# Patient Record
Sex: Female | Born: 1945 | Race: Black or African American | Hispanic: No | Marital: Single | Smoking: Never smoker
Health system: Southern US, Community
[De-identification: ages and names within clinical notes are randomized; demographics above are authoritative.]

---

## 2008-12-01 ENCOUNTER — Encounter: Admission: RE | Admit: 2008-12-01 | Discharge: 2008-12-01 | Payer: Self-pay | Admitting: Internal Medicine

## 2010-02-01 ENCOUNTER — Encounter: Admission: RE | Admit: 2010-02-01 | Discharge: 2010-02-01 | Payer: Self-pay | Admitting: Internal Medicine

## 2010-07-08 ENCOUNTER — Encounter: Payer: Self-pay | Admitting: Internal Medicine

## 2010-09-12 ENCOUNTER — Other Ambulatory Visit (HOSPITAL_COMMUNITY)
Admission: RE | Admit: 2010-09-12 | Discharge: 2010-09-12 | Disposition: A | Discharge status: Home / Self Care | Payer: BC Managed Care – PPO | Source: Ambulatory Visit | Attending: Obstetrics and Gynecology | Admitting: Obstetrics and Gynecology

## 2010-09-12 DIAGNOSIS — Z01419 Encounter for gynecological examination (general) (routine) without abnormal findings: Secondary | ICD-10-CM | POA: Insufficient documentation

## 2012-12-21 ENCOUNTER — Other Ambulatory Visit: Payer: Self-pay

## 2012-12-21 DIAGNOSIS — Z1231 Encounter for screening mammogram for malignant neoplasm of breast: Secondary | ICD-10-CM

## 2013-01-12 ENCOUNTER — Ambulatory Visit: Payer: BC Managed Care – PPO

## 2013-01-22 ENCOUNTER — Ambulatory Visit
Admission: RE | Admit: 2013-01-22 | Discharge: 2013-01-22 | Disposition: A | Discharge status: Home / Self Care | Payer: BC Managed Care – PPO | Source: Ambulatory Visit

## 2013-01-22 DIAGNOSIS — Z1231 Encounter for screening mammogram for malignant neoplasm of breast: Secondary | ICD-10-CM

## 2013-09-14 ENCOUNTER — Ambulatory Visit: Payer: Self-pay | Admitting: Podiatry

## 2014-06-28 ENCOUNTER — Ambulatory Visit: Payer: BC Managed Care – PPO | Admitting: Podiatry

## 2014-07-12 ENCOUNTER — Ambulatory Visit (INDEPENDENT_AMBULATORY_CARE_PROVIDER_SITE_OTHER): Payer: BC Managed Care – PPO | Admitting: Podiatry

## 2014-07-12 ENCOUNTER — Ambulatory Visit (INDEPENDENT_AMBULATORY_CARE_PROVIDER_SITE_OTHER): Payer: BC Managed Care – PPO

## 2014-07-12 ENCOUNTER — Encounter: Payer: Self-pay | Admitting: Podiatry

## 2014-07-12 VITALS — BP 155/90 | HR 104 | Resp 16

## 2014-07-12 DIAGNOSIS — L603 Nail dystrophy: Secondary | ICD-10-CM | POA: Diagnosis not present

## 2014-07-12 DIAGNOSIS — M2012 Hallux valgus (acquired), left foot: Secondary | ICD-10-CM | POA: Diagnosis not present

## 2014-07-12 DIAGNOSIS — L600 Ingrowing nail: Secondary | ICD-10-CM | POA: Diagnosis not present

## 2014-07-12 DIAGNOSIS — B351 Tinea unguium: Secondary | ICD-10-CM | POA: Diagnosis not present

## 2014-07-12 NOTE — Progress Notes (Signed)
   Subjective:    Patient ID: Janet Maynard, female    DOB: Aug 18, 1945, 69 y.o.   MRN: [id number]  HPI Comments: "I have an ingrown"  Patient c/o tender 1st toe left, medial border, for about 6 weeks. She went to get pedicure and noticed it was getting sore soon after. She has been soaking in epsom salts-some help.  Also, she is concerned about the discoloration 1st toenail right and the bunion she has on the left foot.     Review of Systems  Skin:       Change in nails  All other systems reviewed and are negative.      Objective:   Physical Exam: I have reviewed her past medical history medications allergies surgery social history and review of systems area pulses are strongly palpable bilateral. Neurologic sensorium is intact per Semmes-Weinstein monofilament. Deep tendon reflexes are intact bilateral and muscle strength is 5 over 5 dorsiflexors plantar flexors and inverters and evertors onto the musculature is intact. Orthopedic evaluation demonstrates hallux abductovalgus deformity of the left foot. Mild flexible hammertoe deformities are noted bilateral. Cutaneous evaluation demonstrates supple well-hydrated cutis with a sharply incurvated nail margin along the tibial border of the hallux left. She also demonstrates nail dystrophy possible onychomycosis to the hallux nail plates bilateral. Radiographs confirm moderate hallux abductovalgus deformity with an increase in the first metatarsal angle greater than normal value and hallux abductus angle greater than a normal value. 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 Ingrown nail tibial border hallux left. Possible nail dystrophy can rule out onychomycosis bilateral foot.  Plan: We discussed the etiology pathology conservative versus surgical therapies. At  this point she would like to consider in the near future surgical intervention regarding her left foot. She would like ingrown nail and the bunion taken care of at the same time. At this point we took samples of the nail and skin stay and sent them for pathologic evaluation once this returns we will discuss medical alternatives for onychomycosis if positive.

## 2014-07-12 NOTE — Patient Instructions (Signed)
Betadine Soak Instructions  Purchase an 8 oz. bottle of BETADINE solution (Povidone)  THE DAY AFTER THE PROCEDURE  Place 1 tablespoon of betadine solution in a quart of warm tap water.  Submerge your foot or feet with outer bandage intact for the initial soak; this will allow the bandage to become moist and wet for easy lift off.  Once you remove your bandage, continue to soak in the solution for 20 minutes.  This soak should be done twice a day.  Next, remove your foot or feet from solution, blot dry the affected area and cover.  You may use a band aid large enough to cover the area or use gauze and tape.  Apply other medications to the area as directed by the doctor such as cortisporin otic solution (ear drops) or neosporin.  IF YOUR SKIN BECOMES IRRITATED WHILE USING THESE INSTRUCTIONS, IT IS OKAY TO SWITCH TO EPSOM SALTS AND WATER OR WHITE VINEGAR AND WATER.   Long Term Care Instructions-Post Nail Surgery  You have had your ingrown toenail and root treated with a chemical.  This chemical causes a burn that will drain and ooze like a blister.  This can drain for 6-8 weeks or longer.  It is important to keep this area clean, covered, and follow the soaking instructions dispensed at the time of your surgery.  This area will eventually dry and form a scab.  Once the scab forms you no longer need to soak or apply a dressing.  If at any time you experience an increase in pain, redness, swelling, or drainage, you should contact the office as soon as possible.    Bunion (Hallux Valgus) A bony bump (protrusion) on the inside of the foot, at the base of the first toe, is called a bunion (hallux valgus). A bunion causes the first toe to angle toward the other toes. SYMPTOMS  1. A bony bump on the inside of the foot, causing an outward turning of the first toe. It may also overlap the second toe. 2. Thickening of the skin (callus) over the bony bump. 3. Fluid buildup under the callus. Fluid may become  red, tender, and swollen (inflamed) with constant irritation or pressure. 4. Foot pain and stiffness. CAUSES  Many causes exist, including:  Inherited from your family (genetics).  Injury (trauma) forcing the first toe into a position in which it overlaps other toes.  Bunions are also associated with wearing shoes that have a narrow toe box (pointy shoes). RISK INCREASES WITH:  Family history of foot abnormalities, especially bunions.  Arthritis.  Narrow shoes, especially high heels. PREVENTION  Wear shoes with a wide toe box.  Avoid shoes with high heels.  Wear a small pad between the big toe and second toe.  Maintain proper conditioning:  Foot and ankle flexibility.  Muscle strength and endurance. PROGNOSIS  With proper treatment, bunions can typically be cured. Occasionally, surgery is required.  RELATED COMPLICATIONS   Infection of the bunion.  Arthritis of the first toe.  Risks of surgery, including infection, bleeding, injury to nerves (numb toe), recurrent bunion, overcorrection (toe points inward), arthritis of the big toe, big toe pointing upward, and bone not healing. TREATMENT  Treatment first consists of stopping the activities that aggravate the pain, taking pain medicines, and icing to reduce inflammation and pain. Wear shoes with a wide toe box. Shoes can be modified by a shoe repair person to relieve pressure on the bunion, especially if you cannot find shoes with a  wide enough toe box. You may also place a pad with the center cut out in your shoe, to reduce pressure on the bunion. Sometimes, an arch support (orthotic) may reduce pressure on the bunion and alleviate the symptoms. Stretching and strengthening exercises for the muscles of the foot may be useful. You may choose to wear a brace or pad at night to hold the big toe away from the second toe. If non-surgical treatments are not successful, surgery may be needed. Surgery involves removing the overgrown  tissue and correcting the position of the first toe, by realigning the bones. Bunion surgery is typically performed on an outpatient basis, meaning you can go home the same day as surgery. The surgery may involve cutting the mid portion of the bone of the first toe, or just cutting and repairing (reconstructing) the ligaments and soft tissues around the first toe.  MEDICATION   If pain medicine is needed, nonsteroidal anti-inflammatory medicines, such as aspirin and ibuprofen, or other minor pain relievers, such as acetaminophen, are often recommended.  Do not take pain medicine for 7 days before surgery.  Prescription pain relievers are usually only prescribed after surgery. Use only as directed and only as much as you need.  Ointments applied to the skin may be helpful. HEAT AND COLD  Cold treatment (icing) relieves pain and reduces inflammation. Cold treatment should be applied for 10 to 15 minutes every 2 to 3 hours for inflammation and pain and immediately after any activity that aggravates your symptoms. Use ice packs or an ice massage.  Heat treatment may be used prior to performing the stretching and strengthening activities prescribed by your caregiver, physical therapist, or athletic trainer. Use a heat pack or a warm soak. SEEK MEDICAL CARE IF:   Symptoms get worse or do not improve in 2 weeks, despite treatment.  After surgery, you develop fever, increasing pain, redness, swelling, drainage of fluids, bleeding, or increasing warmth around the surgical area.  New, unexplained symptoms develop. (Drugs used in treatment may produce side effects.) Document Released: 06/03/2005 Document Revised: 08/26/2011 Document Reviewed: 09/15/2008 [facility] Patient Information 2015 [employer], [place]. This information is not intended to replace advice given to you by your health care provider. Make sure you discuss any questions you have with your health care provider.   Onychomycosis/Fungal  Toenails  WHAT IS IT? An infection that lies within the keratin of your nail plate that is caused by a fungus.  WHY ME? Fungal infections affect all ages, sexes, races, and creeds.  There may be many factors that predispose you to a fungal infection such as age, coexisting medical conditions such as diabetes, or an autoimmune disease; stress, medications, fatigue, genetics, etc.  Bottom line: fungus thrives in a warm, moist environment and your shoes offer such a location.  IS IT CONTAGIOUS? Theoretically, yes.  You do not want to share shoes, nail clippers or files with someone who has fungal toenails.  Walking around barefoot in the same room or sleeping in the same bed is unlikely to transfer the organism.  It is important to realize, however, that fungus can spread easily from one nail to the next on the same foot.  HOW DO WE TREAT THIS?  There are several ways to treat this condition.  Treatment may depend on many factors such as age, medications, pregnancy, liver and kidney conditions, etc.  It is best to ask your doctor which options are available to you.  5. No treatment.   Unlike many  other medical concerns, you can live with this condition.  However for many people this can be a painful condition and may lead to ingrown toenails or a bacterial infection.  It is recommended that you keep the nails cut short to help reduce the amount of fungal nail. 6. Topical treatment.  These range from herbal remedies to prescription strength nail lacquers.  About 40-50% effective, topicals require twice daily application for approximately 9 to 12 months or until an entirely new nail has grown out.  The most effective topicals are medical grade medications available through physicians offices. 7. Oral antifungal medications.  With an 80-90% cure rate, the most common oral medication requires 3 to 4 months of therapy and stays in your system for a year as the new nail grows out.  Oral antifungal medications do  require blood work to make sure it is a safe drug for you.  A liver function panel will be performed prior to starting the medication and after the first month of treatment.  It is important to have the blood work performed to avoid any harmful side effects.  In general, this medication safe but blood work is required. 8. Laser Therapy.  This treatment is performed by applying a specialized laser to the affected nail plate.  This therapy is noninvasive, fast, and non-painful.  It is not covered by insurance and is therefore, out of pocket.  The results have been very good with a 80-95% cure rate.  [facility] is the only practice in the area to offer this therapy. 9. Permanent Nail Avulsion.  Removing the entire nail so that a new nail will not grow back.

## 2014-07-27 DIAGNOSIS — Z111 Encounter for screening for respiratory tuberculosis: Secondary | ICD-10-CM | POA: Diagnosis not present

## 2014-08-04 ENCOUNTER — Encounter: Payer: Self-pay | Admitting: Podiatry

## 2014-08-16 ENCOUNTER — Ambulatory Visit: Payer: BC Managed Care – PPO | Admitting: Podiatry

## 2015-01-27 DIAGNOSIS — R21 Rash and other nonspecific skin eruption: Secondary | ICD-10-CM | POA: Diagnosis not present

## 2015-01-27 DIAGNOSIS — L989 Disorder of the skin and subcutaneous tissue, unspecified: Secondary | ICD-10-CM | POA: Diagnosis not present

## 2015-02-15 DIAGNOSIS — L089 Local infection of the skin and subcutaneous tissue, unspecified: Secondary | ICD-10-CM | POA: Diagnosis not present

## 2015-02-15 DIAGNOSIS — L821 Other seborrheic keratosis: Secondary | ICD-10-CM | POA: Diagnosis not present

## 2015-04-11 DIAGNOSIS — L820 Inflamed seborrheic keratosis: Secondary | ICD-10-CM | POA: Diagnosis not present

## 2015-05-18 DIAGNOSIS — L821 Other seborrheic keratosis: Secondary | ICD-10-CM | POA: Diagnosis not present

## 2015-05-18 DIAGNOSIS — L810 Postinflammatory hyperpigmentation: Secondary | ICD-10-CM | POA: Diagnosis not present

## 2015-05-18 DIAGNOSIS — K820 Obstruction of gallbladder: Secondary | ICD-10-CM | POA: Diagnosis not present

## 2015-06-02 DIAGNOSIS — L821 Other seborrheic keratosis: Secondary | ICD-10-CM | POA: Diagnosis not present

## 2015-06-02 DIAGNOSIS — L820 Inflamed seborrheic keratosis: Secondary | ICD-10-CM | POA: Diagnosis not present

## 2015-06-02 DIAGNOSIS — L810 Postinflammatory hyperpigmentation: Secondary | ICD-10-CM | POA: Diagnosis not present

## 2015-08-10 DIAGNOSIS — I10 Essential (primary) hypertension: Secondary | ICD-10-CM | POA: Diagnosis not present

## 2015-08-29 DIAGNOSIS — Z0000 Encounter for general adult medical examination without abnormal findings: Secondary | ICD-10-CM | POA: Diagnosis not present

## 2015-08-29 DIAGNOSIS — E663 Overweight: Secondary | ICD-10-CM | POA: Diagnosis not present

## 2015-08-29 DIAGNOSIS — L989 Disorder of the skin and subcutaneous tissue, unspecified: Secondary | ICD-10-CM | POA: Diagnosis not present

## 2015-08-29 DIAGNOSIS — E7800 Pure hypercholesterolemia, unspecified: Secondary | ICD-10-CM | POA: Diagnosis not present

## 2015-08-29 DIAGNOSIS — Z1389 Encounter for screening for other disorder: Secondary | ICD-10-CM | POA: Diagnosis not present

## 2015-08-29 DIAGNOSIS — I10 Essential (primary) hypertension: Secondary | ICD-10-CM | POA: Diagnosis not present

## 2015-08-29 DIAGNOSIS — Z6834 Body mass index (BMI) 34.0-34.9, adult: Secondary | ICD-10-CM | POA: Diagnosis not present

## 2015-12-14 DIAGNOSIS — M79603 Pain in arm, unspecified: Secondary | ICD-10-CM | POA: Diagnosis not present

## 2016-05-06 DIAGNOSIS — Z23 Encounter for immunization: Secondary | ICD-10-CM | POA: Diagnosis not present

## 2017-03-27 DIAGNOSIS — I10 Essential (primary) hypertension: Secondary | ICD-10-CM | POA: Insufficient documentation

## 2018-02-20 ENCOUNTER — Other Ambulatory Visit: Payer: Self-pay | Admitting: Internal Medicine

## 2018-02-20 DIAGNOSIS — Z1231 Encounter for screening mammogram for malignant neoplasm of breast: Secondary | ICD-10-CM

## 2018-02-23 ENCOUNTER — Encounter: Payer: Self-pay | Admitting: Radiology

## 2018-02-23 ENCOUNTER — Ambulatory Visit
Admission: RE | Admit: 2018-02-23 | Discharge: 2018-02-23 | Disposition: A | Discharge status: Home / Self Care | Payer: Medicare Other | Source: Ambulatory Visit | Attending: Internal Medicine | Admitting: Internal Medicine

## 2018-02-23 DIAGNOSIS — Z1231 Encounter for screening mammogram for malignant neoplasm of breast: Secondary | ICD-10-CM

## 2018-03-05 ENCOUNTER — Ambulatory Visit: Payer: BC Managed Care – PPO | Admitting: Podiatry

## 2018-03-12 ENCOUNTER — Encounter: Payer: Self-pay | Admitting: Podiatry

## 2018-03-12 ENCOUNTER — Ambulatory Visit: Payer: Medicare Other | Admitting: Podiatry

## 2018-03-12 ENCOUNTER — Ambulatory Visit (INDEPENDENT_AMBULATORY_CARE_PROVIDER_SITE_OTHER): Payer: Medicare Other

## 2018-03-12 VITALS — BP 116/74 | HR 84 | Resp 16

## 2018-03-12 DIAGNOSIS — M7662 Achilles tendinitis, left leg: Secondary | ICD-10-CM

## 2018-03-12 DIAGNOSIS — M722 Plantar fascial fibromatosis: Secondary | ICD-10-CM

## 2018-03-12 MED ORDER — METHYLPREDNISOLONE 4 MG PO TBPK: ORAL_TABLET | ORAL | 0 refills | Status: DC

## 2018-03-12 MED ORDER — MELOXICAM 15 MG PO TABS: 15.0000 mg | ORAL_TABLET | Freq: Every day | ORAL | 3 refills | Status: AC

## 2018-03-12 NOTE — Patient Instructions (Signed)

## 2018-03-12 NOTE — Progress Notes (Signed)
  Subjective:  Patient ID: Janet Maynard, female    DOB: [birth year],  MRN: [id number] HPI Chief Complaint  Patient presents with  . Foot Pain    Plantar and posterior heel left - aching x couple months, AM pain, tried aleve prn  . New Patient (Initial Visit)    Est pt 2016    72 y.o. female presents with the above complaint.   ROS: Denies fever chills nausea vomiting muscle aches pains calf pain back pain chest pain shortness of breath.  No past medical history on file. No past surgical history on file.  Current Outpatient Medications:  .  amLODipine (NORVASC) 10 MG tablet, Take 10 mg by mouth daily., Disp: , Rfl:  .  FLUAD 0.5 ML SUSY, ADM 0.5ML IM UTD, Disp: , Rfl: 0 .  meloxicam (MOBIC) 15 MG tablet, Take 1 tablet (15 mg total) by mouth daily., Disp: 30 tablet, Rfl: 3 .  methylPREDNISolone (MEDROL DOSEPAK) 4 MG TBPK tablet, 6 day dose pack - take as directed, Disp: 21 tablet, Rfl: 0 .  olmesartan-hydrochlorothiazide (BENICAR HCT) 40-25 MG tablet, , Disp: , Rfl:  .  triamcinolone cream (KENALOG) 0.1 %, APP EXT AA BID FOR 10 DAYS, Disp: , Rfl: 3  No Known Allergies Review of Systems Objective:   Vitals:   03/12/18 0945  BP: 116/74  Pulse: 84  Resp: 16    General: Well developed, nourished, in no acute distress, alert and oriented x3   Dermatological: Skin is warm, dry and supple bilateral. Nails x 10 are well maintained; remaining integument appears unremarkable at this time. There are no open sores, no preulcerative lesions, no rash or signs of infection present.  Vascular: Dorsalis Pedis artery and Posterior Tibial artery pedal pulses are 2/4 bilateral with immedate capillary fill time. Pedal hair growth present. No varicosities and no lower extremity edema present bilateral.   Neruologic: Grossly intact via light touch bilateral. Vibratory intact via tuning fork bilateral. Protective threshold with Semmes Wienstein monofilament intact to all pedal sites bilateral.  Patellar and Achilles deep tendon reflexes 2+ bilateral. No Babinski or clonus noted bilateral.   Musculoskeletal: No gross boney pedal deformities bilateral. No pain, crepitus, or limitation noted with foot and ankle range of motion bilateral. Muscular strength 5/5 in all groups tested bilateral.  Gait: Unassisted, Nonantalgic.    Radiographs:  Radiographs taken demonstrate a small soft tissue increase in density plantar fashion calcaneal insertion site with no swelling of the Achilles left foot.  Assessment & Plan:   Assessment: Plantar fasciitis.  Plan: Discussed etiology pathology conservative versus surgical therapies at this point after sterile Betadine skin prep I injected 20 mg Kenalog 5 mg Marcaine plus her plantar fascial brace to be followed by a night splint.  Discussed appropriate shoe gear stretching exercise ice therapy start on a Medrol Dosepak to be followed by meloxicam.  I will follow-up with her in 1 month.     Max T. [place], [place]

## 2018-04-14 ENCOUNTER — Ambulatory Visit: Payer: Medicare Other | Admitting: Podiatry

## 2018-05-12 ENCOUNTER — Ambulatory Visit: Payer: Medicare Other | Admitting: Podiatry

## 2018-05-12 ENCOUNTER — Encounter: Payer: Self-pay | Admitting: Podiatry

## 2018-05-12 DIAGNOSIS — L600 Ingrowing nail: Secondary | ICD-10-CM

## 2018-05-12 DIAGNOSIS — M722 Plantar fascial fibromatosis: Secondary | ICD-10-CM

## 2018-05-12 MED ORDER — NEOMYCIN-POLYMYXIN-HC 1 % OT SOLN: OTIC | 1 refills | Status: DC

## 2018-05-12 NOTE — Patient Instructions (Signed)

## 2018-05-13 NOTE — Progress Notes (Signed)
She presents today chief complaint of continued pain to the left heel.  States that is better than it was but is still painful.  She states that she is also like to have the borders of the nails removed if possible.  They have been painful for her for quite some time but like to have them removed so that there is no longer tender when she goes to have pedicures done.  Objective: Vital signs are stable she is alert and oriented x3.  Pulses are palpable.  Neurologic sensorium is intact.  Still has pain on palpation medial calcaneal tubercle of the left heel.  Tibiofibular borders of the hallux bilaterally are slightly incurvated mildly tender on palpation there is no erythema cellulitis drainage or odor no purulence no malodor no abscess suspect present.  Assessment: Resolving plantar fasciitis left.  Ingrown toenails tibiofibular border of the hallux bilateral.  Plan: Discussed etiology pathology conservative or surgical therapies.  At this point went ahead and injected her left heel with 20 mg Kenalog 5 mg Marcaine provided her with local anesthetic to the hallux bilaterally and chemical matrixectomy's were performed.  Her left hallux is bothering her during the procedure he did not quite get all the way numb but she tolerated this well without complications.  She was provided with both oral and written home-going instruction for the care and soaking of his toe as well as prescription for Cortisporin Otic to be applied twice daily.  I will follow-up with her in a couple weeks to make sure she is doing well evaluating the toenails as well as the plantar fascia.

## 2018-05-26 ENCOUNTER — Ambulatory Visit: Payer: Medicare Other | Admitting: Podiatry

## 2018-07-14 ENCOUNTER — Encounter: Payer: Self-pay | Admitting: Podiatry

## 2018-07-14 ENCOUNTER — Ambulatory Visit (INDEPENDENT_AMBULATORY_CARE_PROVIDER_SITE_OTHER): Payer: Medicare Other | Admitting: Podiatry

## 2018-07-14 DIAGNOSIS — M722 Plantar fascial fibromatosis: Secondary | ICD-10-CM

## 2018-07-14 DIAGNOSIS — L603 Nail dystrophy: Secondary | ICD-10-CM | POA: Diagnosis not present

## 2018-07-15 NOTE — Progress Notes (Signed)
She presents today for follow-up of her matrixectomy's hallux bilaterally.  States her toenails fell off.  Date of procedure 05/12/2018.  She states that the plantar fasciitis is doing much better.  Objective: Vital signs are stable she is alert and oriented x3.  She has no pain on palpation medial calcaneal tubercles bilateral.  It appears that the top layer of the toenail has difficulty aided but there is still considerable amount of nail that is present related to the bed of the toenail.  At this point I do believe everything to grow out nicely she already has new nail growing out and explained to her that this would grow out most likely fine without any problems.  Assessment: Well-healing surgical toes complicated by the nails coming off.  Patient was concerned about this but there is no problems.  Plan: Encouraged her to keep the nailbeds moisturized as much as possible so that the new nail growing out would be able to grow over those nicely.  And she is to notify us with any concerns regarding the toenails or her plantar fasciitis.

## 2019-07-06 ENCOUNTER — Ambulatory Visit
Admission: RE | Admit: 2019-07-06 | Discharge: 2019-07-06 | Disposition: A | Discharge status: Home / Self Care | Payer: Medicare Other | Source: Ambulatory Visit | Attending: Internal Medicine | Admitting: Internal Medicine

## 2019-07-06 ENCOUNTER — Other Ambulatory Visit: Payer: Self-pay

## 2019-07-06 ENCOUNTER — Other Ambulatory Visit: Payer: Self-pay | Admitting: Internal Medicine

## 2019-07-06 DIAGNOSIS — M79641 Pain in right hand: Secondary | ICD-10-CM

## 2019-07-06 DIAGNOSIS — M79642 Pain in left hand: Secondary | ICD-10-CM

## 2019-07-30 ENCOUNTER — Ambulatory Visit: Payer: Medicare PPO | Attending: Internal Medicine

## 2019-07-30 DIAGNOSIS — Z23 Encounter for immunization: Secondary | ICD-10-CM | POA: Insufficient documentation

## 2019-07-30 NOTE — Progress Notes (Signed)
   Covid-19 Vaccination Clinic  Name:  LYNANNE DELGRECO    MRN: [id number] DOB: Jul 25, 1945  07/30/2019  Ms. Malstrom was observed post Covid-19 immunization for 15 minutes without incidence. She was provided with Vaccine Information Sheet and instruction to access the V-Safe system.   Ms. Kellison was instructed to call 911 with any severe reactions post vaccine: Marland Kitchen Difficulty breathing  . Swelling of your face and throat  . A fast heartbeat  . A bad rash all over your body  . Dizziness and weakness    Immunizations Administered    Name Date Dose VIS Date Route   Pfizer COVID-19 Vaccine 07/30/2019  8:44 AM 0.3 mL 05/28/2019 Intramuscular   Manufacturer: [employer], [employer]   Lot: [id number]   NDC: [address]-6

## 2019-08-21 ENCOUNTER — Ambulatory Visit: Payer: Medicare PPO | Attending: Internal Medicine

## 2019-08-21 DIAGNOSIS — Z23 Encounter for immunization: Secondary | ICD-10-CM

## 2019-08-21 NOTE — Progress Notes (Signed)
   Covid-19 Vaccination Clinic  Name:  RHIANN BOUCHER    MRN: [id number] DOB: [birth year]  08/21/2019  Ms. Partington was observed post Covid-19 immunization for 15 minutes without incident. She was provided with Vaccine Information Sheet and instruction to access the V-Safe system.   Ms. Mao was instructed to call 911 with any severe reactions post vaccine: Marland Kitchen Difficulty breathing  . Swelling of face and throat  . A fast heartbeat  . A bad rash all over body  . Dizziness and weakness   Immunizations Administered    Name Date Dose VIS Date Route   Pfizer COVID-19 Vaccine 08/21/2019  2:31 PM 0.3 mL 05/28/2019 Intramuscular   Manufacturer: [employer], [employer]   Lot: [id number]   NDC: [address]-8

## 2020-08-30 DIAGNOSIS — Z1211 Encounter for screening for malignant neoplasm of colon: Secondary | ICD-10-CM | POA: Diagnosis not present

## 2020-08-30 DIAGNOSIS — Z5181 Encounter for therapeutic drug level monitoring: Secondary | ICD-10-CM | POA: Diagnosis not present

## 2020-08-30 DIAGNOSIS — Z1231 Encounter for screening mammogram for malignant neoplasm of breast: Secondary | ICD-10-CM | POA: Diagnosis not present

## 2020-08-30 DIAGNOSIS — E7800 Pure hypercholesterolemia, unspecified: Secondary | ICD-10-CM | POA: Diagnosis not present

## 2020-08-30 DIAGNOSIS — I10 Essential (primary) hypertension: Secondary | ICD-10-CM | POA: Diagnosis not present

## 2021-04-05 DIAGNOSIS — D100 Benign neoplasm of lip: Secondary | ICD-10-CM | POA: Diagnosis not present

## 2021-04-19 DIAGNOSIS — D100 Benign neoplasm of lip: Secondary | ICD-10-CM | POA: Diagnosis not present

## 2021-12-07 DIAGNOSIS — Z1211 Encounter for screening for malignant neoplasm of colon: Secondary | ICD-10-CM | POA: Diagnosis not present

## 2021-12-07 DIAGNOSIS — E663 Overweight: Secondary | ICD-10-CM | POA: Diagnosis not present

## 2021-12-07 DIAGNOSIS — I10 Essential (primary) hypertension: Secondary | ICD-10-CM | POA: Diagnosis not present

## 2021-12-07 DIAGNOSIS — Z1231 Encounter for screening mammogram for malignant neoplasm of breast: Secondary | ICD-10-CM | POA: Diagnosis not present

## 2021-12-07 DIAGNOSIS — E7800 Pure hypercholesterolemia, unspecified: Secondary | ICD-10-CM | POA: Diagnosis not present

## 2022-01-03 IMAGING — CR DG HAND 2V*R*
2 series · 2 of 2 positions shown · non-contrast
Comparison: None.

CLINICAL DATA: Hand pain, joint pain, no injury

EXAM:
RIGHT HAND - 2 VIEW; LEFT HAND - 2 VIEW

[x hand pa right]
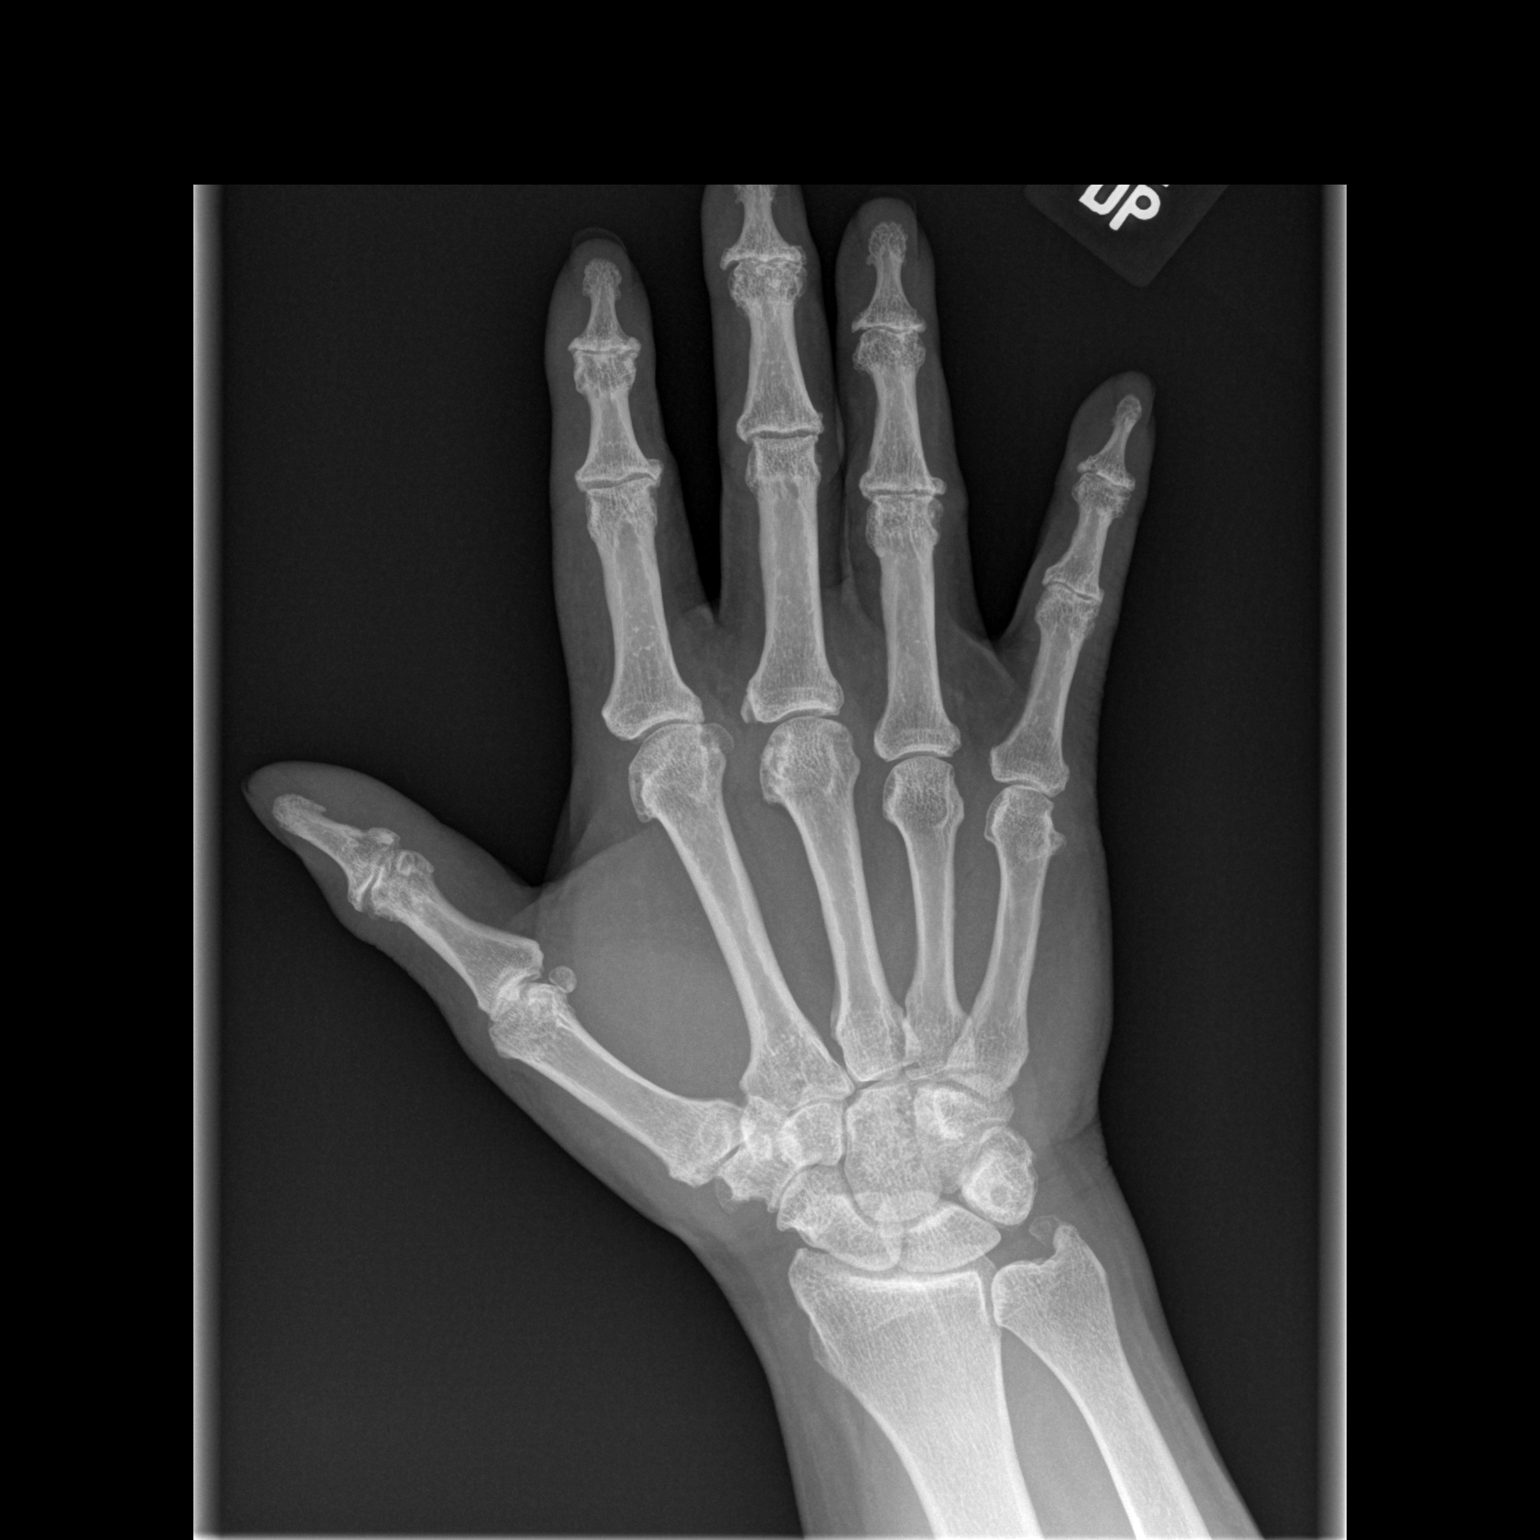

[x hand lat right]
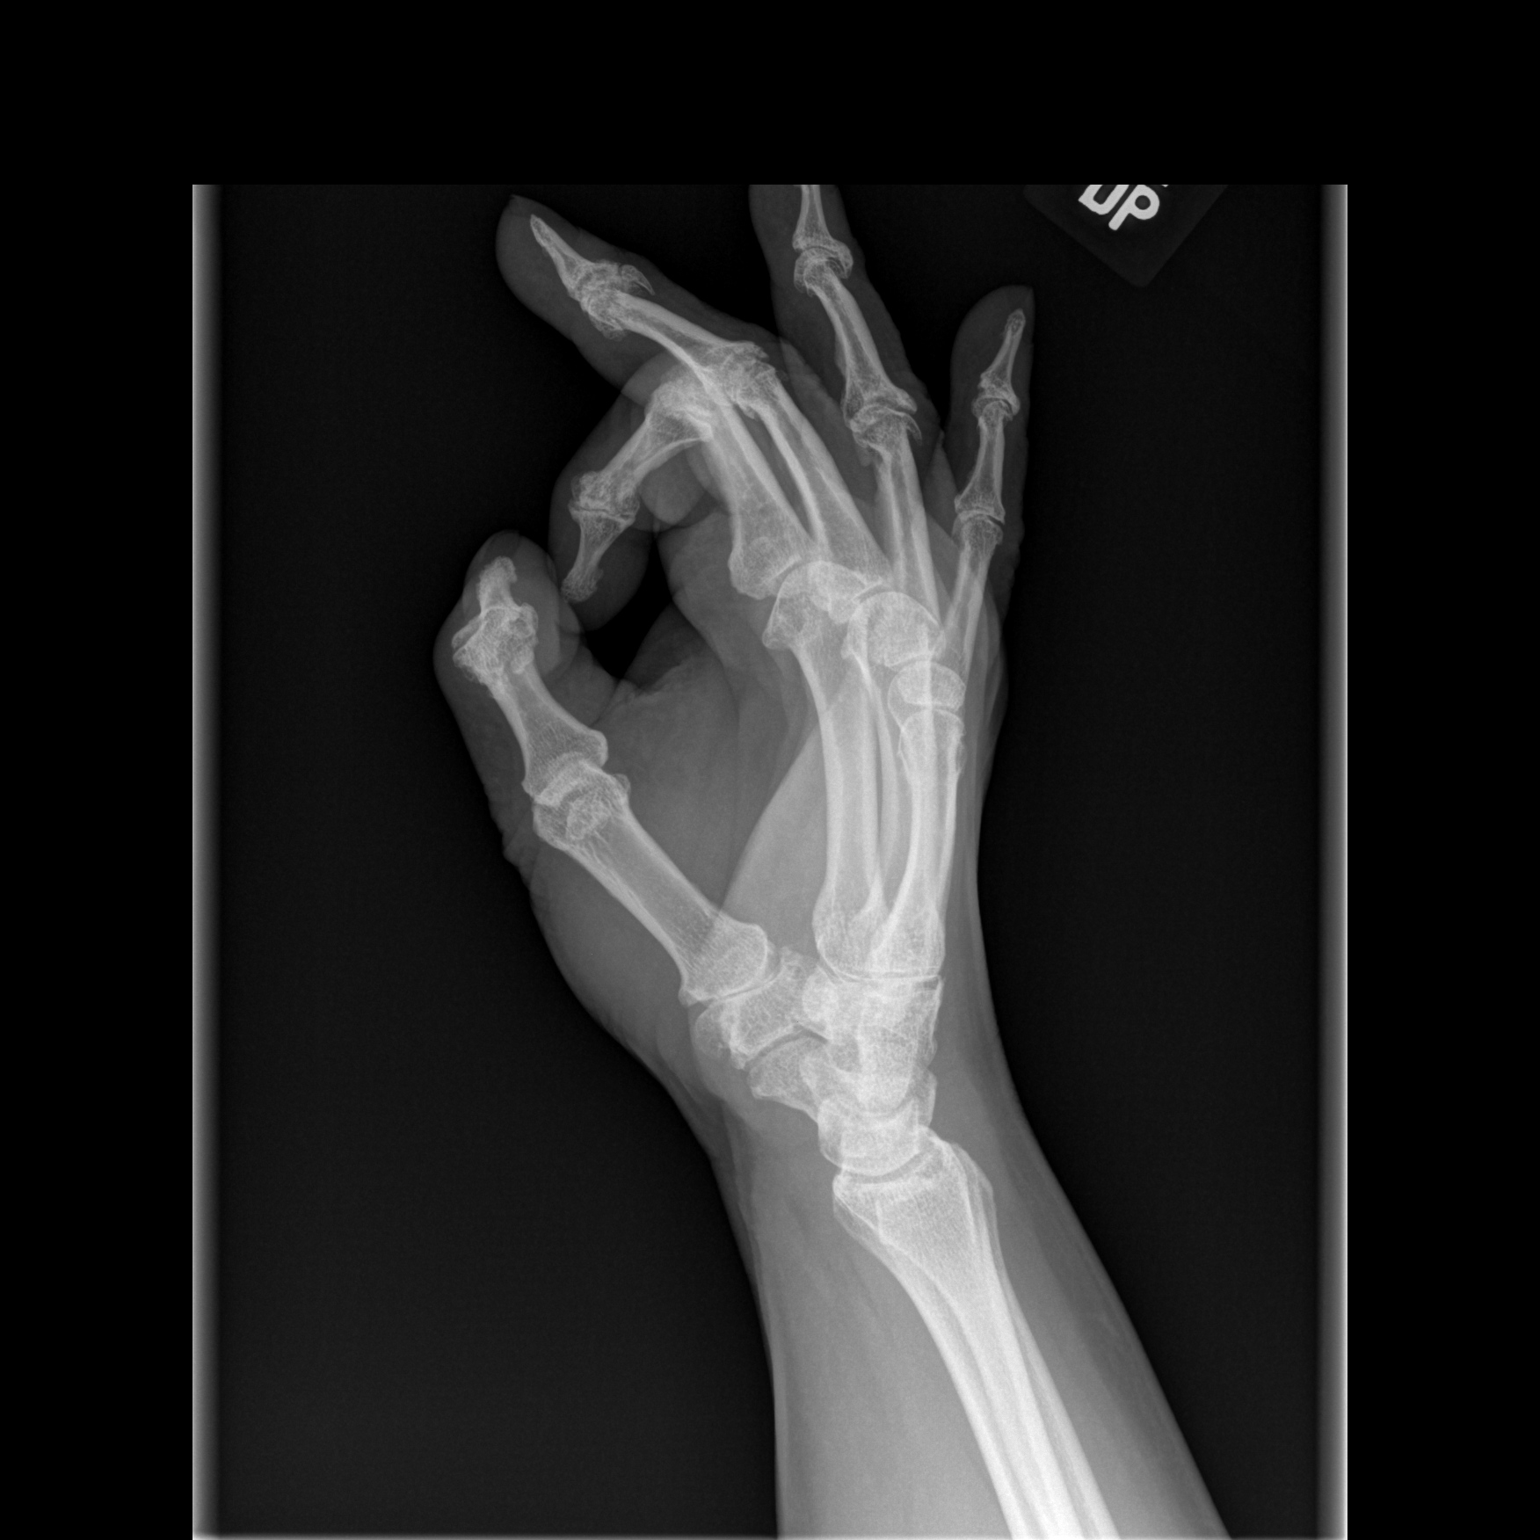

[2 of 2 positions shown; findings below may reference images not displayed]

FINDINGS: No fracture or dislocation of the bilateral hands. There is severe,
symmetric osteoarthritic pattern arthrosis, worst in the distal
interphalangeal joints and thumb basal joints bilaterally but also
involving the proximal interphalangeal joints and first, second, and
third metacarpophalangeal joints. There is no bony erosion or
sclerosis to suggest erosive or inflammatory arthropathy. Soft
tissues are unremarkable.
IMPRESSION: No fracture or dislocation of the bilateral hands. Severe, symmetric
osteoarthritic pattern arthrosis as detailed above. No evidence of
bony erosion or sclerosis to suggest erosive or inflammatory
arthropathy.

## 2022-02-04 ENCOUNTER — Other Ambulatory Visit: Payer: Self-pay | Admitting: Internal Medicine

## 2022-02-04 DIAGNOSIS — Z1231 Encounter for screening mammogram for malignant neoplasm of breast: Secondary | ICD-10-CM

## 2022-02-27 ENCOUNTER — Ambulatory Visit
Admission: RE | Admit: 2022-02-27 | Discharge: 2022-02-27 | Disposition: A | Discharge status: Home / Self Care | Payer: Medicare PPO | Source: Ambulatory Visit | Attending: Internal Medicine | Admitting: Internal Medicine

## 2022-02-27 DIAGNOSIS — Z1231 Encounter for screening mammogram for malignant neoplasm of breast: Secondary | ICD-10-CM

## 2022-12-23 DIAGNOSIS — Z0000 Encounter for general adult medical examination without abnormal findings: Secondary | ICD-10-CM | POA: Diagnosis not present

## 2022-12-23 DIAGNOSIS — E7800 Pure hypercholesterolemia, unspecified: Secondary | ICD-10-CM | POA: Diagnosis not present

## 2022-12-23 DIAGNOSIS — E663 Overweight: Secondary | ICD-10-CM | POA: Diagnosis not present

## 2022-12-23 DIAGNOSIS — N1831 Chronic kidney disease, stage 3a: Secondary | ICD-10-CM | POA: Diagnosis not present

## 2022-12-23 DIAGNOSIS — Z1331 Encounter for screening for depression: Secondary | ICD-10-CM | POA: Diagnosis not present

## 2022-12-23 DIAGNOSIS — I10 Essential (primary) hypertension: Secondary | ICD-10-CM | POA: Diagnosis not present

## 2022-12-23 DIAGNOSIS — E2839 Other primary ovarian failure: Secondary | ICD-10-CM | POA: Diagnosis not present

## 2023-01-28 ENCOUNTER — Other Ambulatory Visit: Payer: Self-pay | Admitting: Internal Medicine

## 2023-01-28 DIAGNOSIS — E2839 Other primary ovarian failure: Secondary | ICD-10-CM

## 2023-07-11 ENCOUNTER — Ambulatory Visit: Payer: Medicare PPO | Admitting: Podiatry

## 2023-07-16 ENCOUNTER — Ambulatory Visit (INDEPENDENT_AMBULATORY_CARE_PROVIDER_SITE_OTHER): Payer: Medicare PPO

## 2023-07-16 ENCOUNTER — Ambulatory Visit: Payer: Medicare PPO | Admitting: Podiatry

## 2023-07-16 ENCOUNTER — Encounter: Payer: Self-pay | Admitting: Podiatry

## 2023-07-16 DIAGNOSIS — M79672 Pain in left foot: Secondary | ICD-10-CM

## 2023-07-16 DIAGNOSIS — M21619 Bunion of unspecified foot: Secondary | ICD-10-CM

## 2023-07-16 DIAGNOSIS — M7752 Other enthesopathy of left foot: Secondary | ICD-10-CM

## 2023-07-16 DIAGNOSIS — M2042 Other hammer toe(s) (acquired), left foot: Secondary | ICD-10-CM | POA: Diagnosis not present

## 2023-07-16 DIAGNOSIS — G8929 Other chronic pain: Secondary | ICD-10-CM | POA: Diagnosis not present

## 2023-07-16 MED ORDER — TRIAMCINOLONE ACETONIDE 10 MG/ML IJ SUSP
10.0000 mg | Freq: Once | INTRAMUSCULAR | Status: AC
  Administered 2023-07-16: 10 mg via INTRA_ARTICULAR

## 2023-07-16 NOTE — Progress Notes (Signed)
Subjective:   Patient ID: Janet Maynard, female   DOB: 78 y.o.   MRN: [id number]   HPI Patient presents with discomfort fifth digit left foot fluid buildup within the toe structural bunion deformity left with concerns about this and the consideration as to whether or not it should be corrected.  States it does not hurt much but is become bigger over time.  Patient does not smoke likes to be active   Review of Systems  All other systems reviewed and are negative.       Objective:  Physical Exam Vitals and nursing note reviewed.  Constitutional:      Appearance: She is well-developed.  Pulmonary:     Effort: Pulmonary effort is normal.  Musculoskeletal:        General: Normal range of motion.  Skin:    General: Skin is warm.  Neurological:     Mental Status: She is alert.     Neurovascular status intact muscle strength was found to be adequate range of motion adequate hyperostosis around the first metatarsal head left fluid buildup with fluid around the fifth digit left lesion and painful when pressed with rotated toe.  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

## 2024-02-24 DIAGNOSIS — I10 Essential (primary) hypertension: Secondary | ICD-10-CM | POA: Diagnosis not present

## 2024-02-24 DIAGNOSIS — N1831 Chronic kidney disease, stage 3a: Secondary | ICD-10-CM | POA: Diagnosis not present

## 2024-02-24 DIAGNOSIS — E7800 Pure hypercholesterolemia, unspecified: Secondary | ICD-10-CM | POA: Diagnosis not present

## 2024-02-24 DIAGNOSIS — E663 Overweight: Secondary | ICD-10-CM | POA: Diagnosis not present

## 2024-03-03 ENCOUNTER — Emergency Department (HOSPITAL_COMMUNITY)

## 2024-03-03 ENCOUNTER — Emergency Department (HOSPITAL_COMMUNITY)
Admission: EM | Admit: 2024-03-03 | Discharge: 2024-03-03 | Disposition: A | Discharge status: Home / Self Care | Attending: Emergency Medicine | Admitting: Emergency Medicine

## 2024-03-03 ENCOUNTER — Encounter (HOSPITAL_COMMUNITY): Payer: Self-pay

## 2024-03-03 DIAGNOSIS — M79605 Pain in left leg: Secondary | ICD-10-CM | POA: Diagnosis not present

## 2024-03-03 DIAGNOSIS — Z79899 Other long term (current) drug therapy: Secondary | ICD-10-CM | POA: Insufficient documentation

## 2024-03-03 DIAGNOSIS — M791 Myalgia, unspecified site: Secondary | ICD-10-CM | POA: Diagnosis not present

## 2024-03-03 DIAGNOSIS — M1612 Unilateral primary osteoarthritis, left hip: Secondary | ICD-10-CM | POA: Diagnosis not present

## 2024-03-03 DIAGNOSIS — M79609 Pain in unspecified limb: Secondary | ICD-10-CM

## 2024-03-03 DIAGNOSIS — R1030 Lower abdominal pain, unspecified: Secondary | ICD-10-CM | POA: Diagnosis present

## 2024-03-03 DIAGNOSIS — I10 Essential (primary) hypertension: Secondary | ICD-10-CM | POA: Diagnosis not present

## 2024-03-03 DIAGNOSIS — M79652 Pain in left thigh: Secondary | ICD-10-CM | POA: Insufficient documentation

## 2024-03-03 LAB — CBC WITH DIFFERENTIAL/PLATELET
Abs Immature Granulocytes: 0.13 K/uL — ABNORMAL HIGH (ref 0.00–0.07)
Basophils Absolute: 0.1 K/uL (ref 0.0–0.1)
Basophils Relative: 1 %
Eosinophils Absolute: 0.1 K/uL (ref 0.0–0.5)
Eosinophils Relative: 1 %
HCT: 43 % (ref 36.0–46.0)
Hemoglobin: 14.7 g/dL (ref 12.0–15.0)
Immature Granulocytes: 1 %
Lymphocytes Relative: 21 %
Lymphs Abs: 2.4 K/uL (ref 0.7–4.0)
MCH: 32.8 pg (ref 26.0–34.0)
MCHC: 34.2 g/dL (ref 30.0–36.0)
MCV: 96 fL (ref 80.0–100.0)
Monocytes Absolute: 1.2 K/uL — ABNORMAL HIGH (ref 0.1–1.0)
Monocytes Relative: 10 %
Neutro Abs: 8 K/uL — ABNORMAL HIGH (ref 1.7–7.7)
Neutrophils Relative %: 66 %
Platelets: 307 K/uL (ref 150–400)
RBC: 4.48 MIL/uL (ref 3.87–5.11)
RDW: 13.3 % (ref 11.5–15.5)
WBC: 11.9 K/uL — ABNORMAL HIGH (ref 4.0–10.5)
nRBC: 0 % (ref 0.0–0.2)

## 2024-03-03 LAB — COMPREHENSIVE METABOLIC PANEL WITH GFR
ALT: 14 U/L (ref 0–44)
AST: 23 U/L (ref 15–41)
Albumin: 4.6 g/dL (ref 3.5–5.0)
Alkaline Phosphatase: 105 U/L (ref 38–126)
Anion gap: 18 — ABNORMAL HIGH (ref 5–15)
BUN: 21 mg/dL (ref 8–23)
CO2: 21 mmol/L — ABNORMAL LOW (ref 22–32)
Calcium: 10.3 mg/dL (ref 8.9–10.3)
Chloride: 99 mmol/L (ref 98–111)
Creatinine, Ser: 1.17 mg/dL — ABNORMAL HIGH (ref 0.44–1.00)
GFR, Estimated: 47 mL/min — ABNORMAL LOW (ref >60)
Glucose, Bld: 110 mg/dL — ABNORMAL HIGH (ref 70–99)
Potassium: 3.2 mmol/L — ABNORMAL LOW (ref 3.5–5.1)
Sodium: 138 mmol/L (ref 135–145)
Total Bilirubin: 0.5 mg/dL (ref 0.0–1.2)
Total Protein: 8.2 g/dL — ABNORMAL HIGH (ref 6.5–8.1)

## 2024-03-03 LAB — CK: Total CK: 178 U/L (ref 38–234)

## 2024-03-03 MED ORDER — IBUPROFEN 400 MG PO TABS: 400.0000 mg | ORAL_TABLET | Freq: Four times a day (QID) | ORAL | 0 refills | Status: AC

## 2024-03-03 MED ORDER — ACETAMINOPHEN 500 MG PO TABS: 500.0000 mg | ORAL_TABLET | Freq: Four times a day (QID) | ORAL | 0 refills | Status: AC

## 2024-03-03 MED ORDER — OXYCODONE-ACETAMINOPHEN 5-325 MG PO TABS
1.0000 | ORAL_TABLET | Freq: Once | ORAL | Status: AC
  Administered 2024-03-03: 1 via ORAL
  Filled 2024-03-03: qty 1

## 2024-03-03 MED ORDER — NAPROXEN 500 MG PO TABS
500.0000 mg | ORAL_TABLET | Freq: Once | ORAL | Status: AC
  Administered 2024-03-03: 500 mg via ORAL
  Filled 2024-03-03: qty 1

## 2024-03-03 MED ORDER — OXYCODONE-ACETAMINOPHEN 5-325 MG PO TABS: 1.0000 | ORAL_TABLET | Freq: Three times a day (TID) | ORAL | 0 refills | Status: AC | PRN

## 2024-03-03 MED ORDER — LIDOCAINE 4 % EX PTCH: 1.0000 | MEDICATED_PATCH | Freq: Two times a day (BID) | CUTANEOUS | 0 refills | Status: AC

## 2024-03-03 NOTE — ED Triage Notes (Signed)
 Pt presents with c/o left inner thigh pain for 2 days, no swelling, no injury, no discoloration, no redness. Pt reports the pain started out of nowhere after doing normal house work.

## 2024-03-03 NOTE — ED Provider Notes (Signed)
 Janet Maynard Provider Note   CSN: [id number] Arrival date & time: 03/03/24  1231     Patient presents with: Leg Pain   Janet Maynard is a 78 y.o. female.   HPI     78 year old patient comes in with chief complaint of groin pain. Patient states that last week she was started on a new cholesterol medication.  About 3 to 5 days ago, she started noticing pain in her groin.  That pain has intensified over time.  Now she is not able to walk because of pain.  She only has history of high blood pressure and cholesterol.  She denies any trauma.  Pt has no hx of PE, DVT and denies any exogenous hormone (testosterone / estrogen) use, long distance travels or surgery in the past 6 weeks, active cancer, recent immobilization.  Patient denies any rash, back pain, hip pain.  Prior to Admission medications   Medication Sig Start Date End Date Taking? Authorizing Provider  acetaminophen  (TYLENOL ) 500 MG tablet Take 1 tablet (500 mg total) by mouth in the morning, at noon, in the evening, and at bedtime. 03/03/24  Yes Charlyn Sora, MD  ibuprofen  (ADVIL ) 400 MG tablet Take 1 tablet (400 mg total) by mouth 4 (four) times daily. 03/03/24  Yes Charlyn Sora, MD  lidocaine  4 % Place 1 patch onto the skin 2 (two) times daily. 03/03/24  Yes Charlyn Sora, MD  oxyCODONE -acetaminophen  (PERCOCET/ROXICET) 5-325 MG tablet Take 1 tablet by mouth every 8 (eight) hours as needed for severe pain (pain score 7-10). 03/03/24  Yes Johnnie Goynes, MD  amLODipine (NORVASC) 10 MG tablet Take 10 mg by mouth daily.    [provider]  chlorhexidine (PERIDEX) 0.12 % solution  06/29/18   [provider]  FLUAD 0.5 ML SUSY ADM 0.5ML IM UTD 02/23/18   [provider]  HYDROcodone-acetaminophen  (NORCO/VICODIN) 5-325 MG tablet TK 1 T PO Q 6 H PRF PAIN 06/29/18   [provider]  meloxicam  (MOBIC ) 15 MG tablet Take 1 tablet (15 mg total) by mouth daily.  03/12/18   Hyatt, Max T, DPM  olmesartan-hydrochlorothiazide (BENICAR HCT) 40-25 MG tablet  03/11/18   [provider]  triamcinolone  cream (KENALOG ) 0.1 % APP EXT AA BID FOR 10 DAYS 02/20/18   [provider]    Allergies: Patient has no known allergies.    Review of Systems  All other systems reviewed and are negative.   Updated Vital Signs BP (!) 173/84 (BP Location: Right Arm)   Pulse 97   Temp 98.3 F (36.8 C) (Oral)   Resp 18   SpO2 94%   Physical Exam Vitals and nursing note reviewed.  Constitutional:      Appearance: She is well-developed.  HENT:     Head: Atraumatic.  Cardiovascular:     Rate and Rhythm: Normal rate.  Pulmonary:     Effort: Pulmonary effort is normal.  Musculoskeletal:     Cervical back: Normal range of motion and neck supple.     Comments: Patient has reproducible tenderness with palpation of the groin region, particularly near the inguinal region.  No hip tenderness. No unilateral leg swelling or calf tenderness noted  Skin:    General: Skin is warm and dry.  Neurological:     Mental Status: She is alert and oriented to person, place, and time.     (all labs ordered are listed, but only abnormal results are displayed) Labs Reviewed  COMPREHENSIVE METABOLIC PANEL WITH GFR - Abnormal; Notable for the following components:      Result Value   Potassium 3.2 (*)    CO2 21 (*)    Glucose, Bld 110 (*)    Creatinine, Ser 1.17 (*)    Total Protein 8.2 (*)    GFR, Estimated 47 (*)    Anion gap 18 (*)    All other components within normal limits  CBC WITH DIFFERENTIAL/PLATELET - Abnormal; Notable for the following components:   WBC 11.9 (*)    Neutro Abs 8.0 (*)    Monocytes Absolute 1.2 (*)    Abs Immature Granulocytes 0.13 (*)    All other components within normal limits  CK    EKG: None  Radiology: DG Femur 1 View Left Result Date: 03/03/2024 CLINICAL DATA:  Thigh pain for 2 days. EXAM: LEFT FEMUR 1 VIEW COMPARISON:   None Available. FINDINGS: Divided frontal view of the femur submitted. No fracture. No evidence of erosion, periostitis or focal bone abnormality. No hip or knee dislocation. Mild degenerative change. No focal soft tissue abnormalities. IMPRESSION: No acute findings. 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 Electronically Signed   By: Andrea Gasman M.D.   On: 03/03/2024 17:15     Procedures   Medications Ordered in the ED  oxyCODONE -acetaminophen  (PERCOCET/ROXICET) 5-325 MG per tablet 1 tablet (1 tablet Oral Given 03/03/24 1725)  naproxen  (NAPROSYN ) tablet 500 mg (500 mg Oral Given 03/03/24 1726)    Clinical Course as of 03/03/24 2012  Wed Mar 03, 2024  2010 Patient reassessed. She states that her pain has improved significantly.  She still has pain with movement.  Patient has gotten up and applied weight.  I discussed with her that it is unclear what is causing her symptoms at this time, we suspect that is likely musculoskeletal in nature.  Muscle enzymes are normal.  X-ray is normal.  She has follow-up with her PCP in 2 to 3 weeks, I have advised that she tries to get an appointment sooner.  Return precautions discussed. [AN]  2011 Patient is comfortable going home with prescriptions to help her manage her symptoms. [AN]    Clinical Course User Index [AN] Charlyn Sora, MD                                 Medical Decision Making Amount and/or Complexity of Data Reviewed Labs: ordered. Radiology: ordered.  Risk OTC drugs. Prescription drug management.   78 year old patient comes in with chief complaint of severe pain.  Pain is in the left thigh, inguinal region.  On exam, there is no gross swelling, erythema, mass.  Tenderness worse with palpation.  Differential diagnosis that was considered for this patient includes DVT, cellulitis, myositis, medication side effects, contusion, fracture.  Patient has no preceding trauma.  Plan is to get x-ray, basic labs and  give her something for pain.  We will reassess.  Final diagnoses:  Musculoskeletal pain of extremity    ED Discharge Orders          Ordered    oxyCODONE -acetaminophen  (PERCOCET/ROXICET) 5-325 MG tablet  Every 8 hours PRN        03/03/24 2009    ibuprofen  (ADVIL ) 400 MG tablet  4 times daily        03/03/24 2009    acetaminophen  (TYLENOL ) 500 MG tablet  4 times daily  03/03/24 2009    lidocaine  4 %  2 times daily        03/03/24 2009               Charlyn Sora, MD 03/03/24 2012

## 2024-03-03 NOTE — Discharge Instructions (Addendum)
 We signed the emergency room for significant pain in the groin.  Fortunately, with the medicine in the ER, your pain has improved and you have been able to walk.  X-rays are normal.  It appears that the pain is likely musculoskeletal in nature.  Please call your PCP for a closer follow-up next week.  Start taking the medications that are prescribed.  Read the instructions provided for RICE.  Return to the emergency room if your pain gets worse, you are unable to walk again, you start having fevers, chills.

## 2024-04-21 DIAGNOSIS — E7800 Pure hypercholesterolemia, unspecified: Secondary | ICD-10-CM | POA: Diagnosis not present

## 2024-04-21 DIAGNOSIS — Z131 Encounter for screening for diabetes mellitus: Secondary | ICD-10-CM | POA: Diagnosis not present

## 2024-04-21 DIAGNOSIS — Z0000 Encounter for general adult medical examination without abnormal findings: Secondary | ICD-10-CM | POA: Diagnosis not present

## 2024-04-21 DIAGNOSIS — Z1331 Encounter for screening for depression: Secondary | ICD-10-CM | POA: Diagnosis not present

## 2024-04-21 DIAGNOSIS — E663 Overweight: Secondary | ICD-10-CM | POA: Diagnosis not present

## 2024-04-21 DIAGNOSIS — I10 Essential (primary) hypertension: Secondary | ICD-10-CM | POA: Diagnosis not present

## 2024-04-21 DIAGNOSIS — N1831 Chronic kidney disease, stage 3a: Secondary | ICD-10-CM | POA: Diagnosis not present
